# Patient Record
Sex: Female | Born: 1995 | Hispanic: Refuse to answer | Marital: Single | Smoking: Never smoker
Health system: Southern US, Community
[De-identification: ages and names within clinical notes are randomized; demographics above are authoritative.]

---
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
## 2017-01-28 ENCOUNTER — Encounter: Payer: Self-pay | Admitting: Family Medicine

## 2017-01-28 ENCOUNTER — Ambulatory Visit (INDEPENDENT_AMBULATORY_CARE_PROVIDER_SITE_OTHER): Payer: BLUE CROSS/BLUE SHIELD | Admitting: Family Medicine

## 2017-01-28 VITALS — BP 99/62 | HR 75 | Temp 99.0°F | Resp 14

## 2017-01-28 DIAGNOSIS — S060X0A Concussion without loss of consciousness, initial encounter: Secondary | ICD-10-CM

## 2017-01-28 NOTE — Progress Notes (Signed)
Patient presents today with symptoms of concussion. Patient states that she was hit on the right side of her face this morning with a volleyball. She immediately had vision symptoms that then later improved. She denies any loss of consciousness. She denies any history of nausea or vomiting. She denies any history of concussions in the past. She states that mostly right now she feels in a fog and has head pressure. She also has some neck discomfort which she describes as muscular.  ROS: Negative except mentioned above. Vitals as per Epic. 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

## 2017-02-07 ENCOUNTER — Ambulatory Visit (INDEPENDENT_AMBULATORY_CARE_PROVIDER_SITE_OTHER): Payer: BLUE CROSS/BLUE SHIELD | Admitting: Family Medicine

## 2017-02-07 ENCOUNTER — Encounter: Payer: Self-pay | Admitting: Family Medicine

## 2017-02-07 VITALS — BP 123/66 | HR 83 | Temp 99.2°F | Resp 14

## 2017-02-07 DIAGNOSIS — S060X0D Concussion without loss of consciousness, subsequent encounter: Secondary | ICD-10-CM

## 2017-02-07 NOTE — Progress Notes (Signed)
Patient presents today for follow-up regarding her concussion. Patient states that she is feeling back to normal. She returned back to class last Wednesday. She has had no problems with sleep or appetite. She has been able to do work on the computer and has been able to use her phone without any issues. She has not done any physical activity as of yet. She is not taking any medications to relieve any symptoms.  ROS: Negative except mentioned above.  Vitals as per Epic.  GENERAL: NAD HEENT: no pharyngeal erythema, no exudate RESP: CTA B CARD: RRR NEURO: CN II-XII grossly intact, -Rombergs, no nystagmus appreciated  A/P: Concussion - asymptomatic, reviewed Impact Test, patient can start to progress physical activity through protocol, trainer was informed, seek medical attention if any further problems.

## 2017-06-19 IMAGING — CR DG FINGER THUMB 2+V*L*
1 series · 3 of 3 positions shown · non-contrast
Comparison: None.

CLINICAL DATA: Pt states she fell 2 days and bent left thumb
backwards. Pain bruisinf and swelling left 1st metacarpal. shielded

EXAM:
LEFT THUMB 2+V

[Series 1: dg finger thumb left · 0.14mm/px · 3 of 3 slices shown]
[im 1/3]
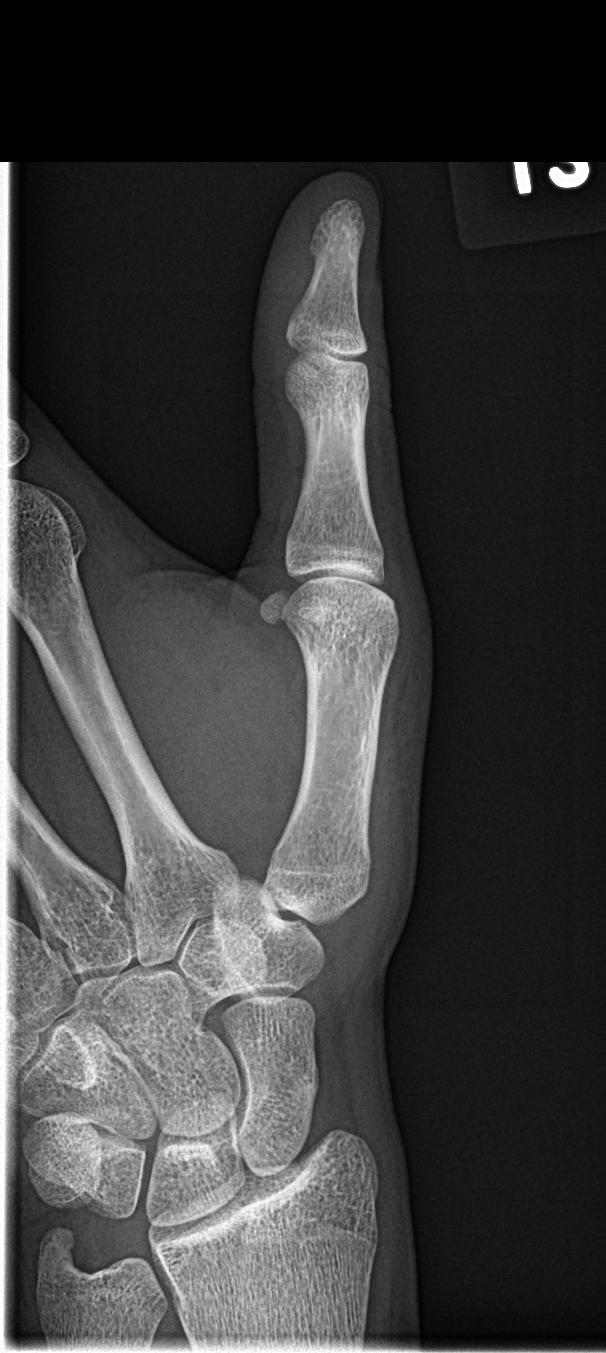
[im 2/3]
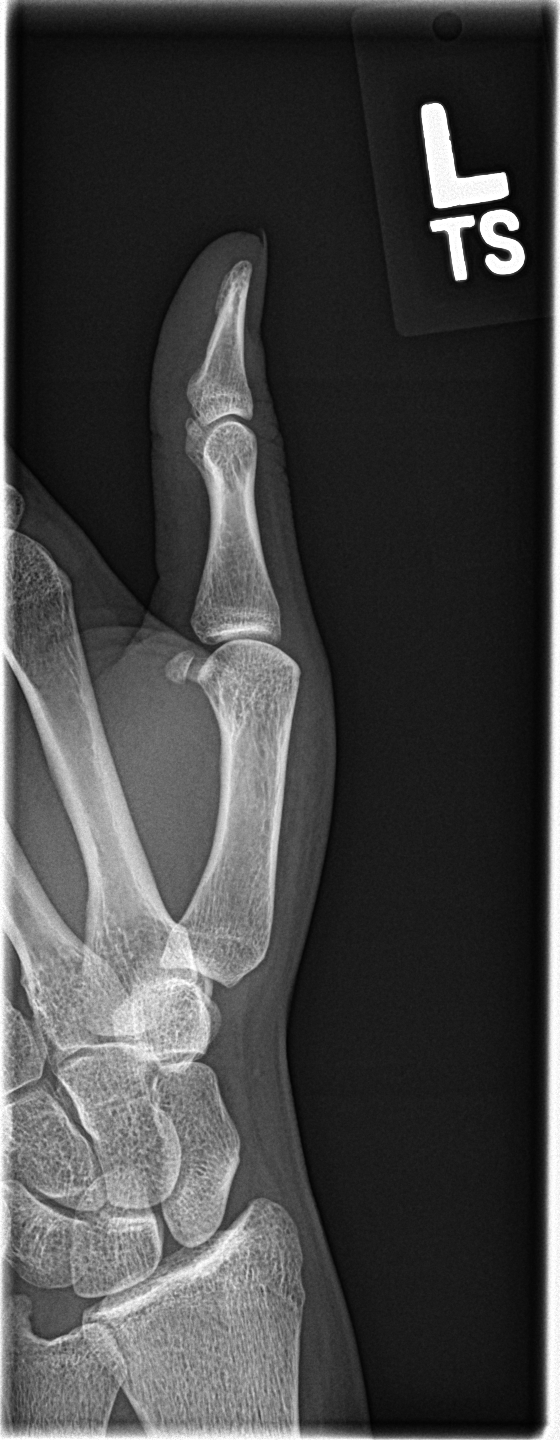
[im 3/3]
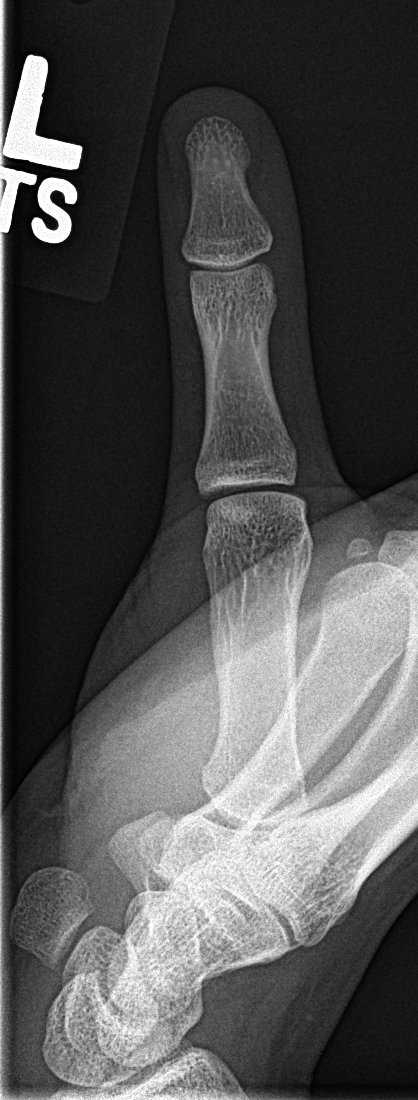

[3 of 3 positions shown; findings below may reference images not displayed]

FINDINGS: There is no evidence of fracture or dislocation. There is no
evidence of arthropathy or other focal bone abnormality. Soft
tissues are unremarkable
IMPRESSION: Negative.

## 2017-08-22 ENCOUNTER — Encounter: Payer: Self-pay | Admitting: Family Medicine

## 2017-08-22 ENCOUNTER — Ambulatory Visit (INDEPENDENT_AMBULATORY_CARE_PROVIDER_SITE_OTHER): Payer: Self-pay | Admitting: Family Medicine

## 2017-08-22 DIAGNOSIS — M25511 Pain in right shoulder: Secondary | ICD-10-CM

## 2017-08-22 DIAGNOSIS — G8929 Other chronic pain: Secondary | ICD-10-CM

## 2017-09-03 NOTE — Progress Notes (Signed)
Patient presents today with chronic shoulder pain. This is the first time I am seeing her for this problem. She admits that she has been able to play volleyball through the symptoms. She continued to have pain this summer so she decided to go see a sports orthopedic physician [place].  She thinks her first subluxation episode began when she was diving for volleyball and landed on her right shoulder which happened last year. She took NSAIDs and worked with her Event organiserathletic trainer to modify activitites. Currently, her pain is intermittent, mild to moderate, exacerbated with serving and improved with rest. She denies associated weakness, numbness or tingling.  She presents today with a copy of her MRI. The MRI was read as showing a SLAP tear with high grade tear of supraspinatus tendon.  She states the recommendation from the surgeon was that she do a trial of PT and activity modification. He believes her symptoms are mostly related to impingement and scap. dysfunction given the overhead sport she plays. He did not feel that surgery was imperative at this time.   ROS: Negative except mentioned above.  GENERAL: NAD MSK: R Shoulder: no focal area of tenderness to palpation, discomfort at 150 degrees of forward flexion, decreased internal rotation, -sulcus, -empty can, -Hawkins, -apprehension, discomfort with lift off, +O'Briens, nv intact  NEURO: CN II-XII grossly intact   A/P: Chronic Right Shoulder Pain - discussed history and exam with orthopedic surgeon in [place] (Dr. Cecelia [place]) who believes Kemi's symptoms are mostly related to impingement, he believes she would benefit from PT and activity modification for now, he does not feel she would successfully return back to her level of volleyball activities if she were to have surgery at this time. He suggests follow-up with him or Dr. Ardine Engiehl is symptoms persist/worsen. I recommend that patient follow up with Dr. Ardine Engiehl the next time he is on campus so  that he can review her MRI and do a thorough shoulder exam. I discussed this with athletic trainer. For now I recommend that she work with PT and also modify activity with volleyball. Will prescribe NSAIDs to take if needed.

## 2017-10-08 ENCOUNTER — Encounter: Payer: Self-pay | Admitting: Family Medicine

## 2017-10-08 ENCOUNTER — Ambulatory Visit (INDEPENDENT_AMBULATORY_CARE_PROVIDER_SITE_OTHER): Payer: Self-pay | Admitting: Family Medicine

## 2017-10-08 VITALS — BP 107/64 | HR 60 | Temp 97.7°F | Resp 14

## 2017-10-08 DIAGNOSIS — R21 Rash and other nonspecific skin eruption: Secondary | ICD-10-CM

## 2017-10-08 MED ORDER — CLOTRIMAZOLE-BETAMETHASONE 1-0.05 % EX CREA: 1.0000 "application " | TOPICAL_CREAM | Freq: Two times a day (BID) | CUTANEOUS | 0 refills | Status: AC

## 2017-10-08 NOTE — Progress Notes (Signed)
Patient presents today with symptoms of rash on her right mid forearm for 2 weeks. She states the area is itchy at times. Denies that it hurts. Initially started off with a few red bumps. She has been covering it during volleyball games and practice. She denies being around any pets or being outside in the grass/woods. She denies wearing anything on that area (ie, hair band, etc). Denies the rash anywhere else.   ROS: Negative except mentioned above. Vitals as per Epic GENERAL: NAD RESP: CTA B CARD: RRR SKIN: erythematous slight raised rash on the volar surface of the mid forearm, no tenderness, no discharge from the area, no streaks, no other rash noted anywhere else NEURO: CN II-XII grossly intact   A/P: Skin Rash Right Forearm - unsure of etiology but will treat with Lotrisone given dermatitis/fungal properties, keep area clean and dry, avoid wearing anything over the area, seek medical attention if symptoms persist or worsen.

## 2017-12-16 ENCOUNTER — Ambulatory Visit (INDEPENDENT_AMBULATORY_CARE_PROVIDER_SITE_OTHER): Payer: Self-pay | Admitting: Family Medicine

## 2017-12-16 VITALS — BP 105/59 | HR 68 | Temp 98.1°F | Resp 14

## 2017-12-16 DIAGNOSIS — R0981 Nasal congestion: Secondary | ICD-10-CM

## 2017-12-16 DIAGNOSIS — H9203 Otalgia, bilateral: Secondary | ICD-10-CM

## 2017-12-16 MED ORDER — AMOXICILLIN 500 MG PO CAPS: 500.0000 mg | ORAL_CAPSULE | Freq: Two times a day (BID) | ORAL | 0 refills | Status: AC

## 2017-12-16 NOTE — Progress Notes (Signed)
Patient presents today with symptoms of ear pain bilaterally.  Patient states that she has had nasal congestion and popping of her ears.  She has had some colored mucus that she has been blowing out of her nose and coughing up.  She denies any fever or chills.  She denies any swimming recently.  She denies any discharge from the ears.  She denies any sore throat, chest pain, shortness of breath, nausea, vomiting, diarrhea, abdominal pain.  She has been taking Sudafed on occasion which has not helped much.  ROS: Negative except mentioned above. Vitals as per Epic GENERAL: NAD HEENT: no significant pharyngeal erythema, no exudate, minimal erythema of right TM, no significant erythema of left TM, no significant bulging bilaterally, no discharge from the ears, no tenderness with movement of pinna, no significant cervical LAD RESP: CTA B CARD: RRR NEURO: CN II-XII grossly intact   A/P: Nasal congestion, Otalgia -treat with Amoxicillin given colored mucus and sinus pressure, Ibuprofen as needed, Claritin-D as needed, seek medical attention if symptoms persist or worsen as discussed.

## 2018-01-24 ENCOUNTER — Ambulatory Visit (INDEPENDENT_AMBULATORY_CARE_PROVIDER_SITE_OTHER): Payer: Self-pay | Admitting: Family Medicine

## 2018-01-24 ENCOUNTER — Encounter: Payer: Self-pay | Admitting: Family Medicine

## 2018-01-24 VITALS — BP 106/73 | HR 83 | Temp 97.8°F | Resp 14

## 2018-01-24 DIAGNOSIS — J029 Acute pharyngitis, unspecified: Secondary | ICD-10-CM

## 2018-01-24 LAB — POCT INFLUENZA A/B
Influenza A, POC: NEGATIVE
Influenza B, POC: NEGATIVE

## 2018-01-24 LAB — POCT RAPID STREP A (OFFICE): Rapid Strep A Screen: NEGATIVE

## 2018-01-24 NOTE — Progress Notes (Signed)
Patient presents today with symptoms of sore throat for the past 3 days.  Patient states that her pain level is about a 6 out of 10.  She denies any fever or chills.  Her sore throat is worse in the morning.  Has had minimal cough associated.  She denies any significant headache neck stiffness photophobia, chest pain, shortness of breath, abdominal pain, extreme fatigue.  She denies any history of infectious mono in the past.  She has been taking Tylenol/Ibuprofen intermittently.  Admits to last menstrual period being 2 weeks ago.  ROS: Negative except mentioned above. Vitals as per Epic GENERAL: NAD HEENT: moderate pharyngeal erythema, no exudate, no erythema of TMs, no significant cervical LAD RESP: CTA B CARD: RRR NEURO: CN II-XII grossly intact   A/P: Pharyngitis -rapid strep test and flu screen today in the office were negative, will send throat culture, Tylenol/ibuprofen as needed, Claritin as needed for any postnasal drip, rest, hydration, seek medical attention if symptoms persist or worsen as discussed.  No athletic activity or class if febrile.

## 2018-01-28 ENCOUNTER — Encounter: Payer: Self-pay | Admitting: Family Medicine

## 2018-01-28 ENCOUNTER — Ambulatory Visit (INDEPENDENT_AMBULATORY_CARE_PROVIDER_SITE_OTHER): Payer: Self-pay | Admitting: Family Medicine

## 2018-01-28 VITALS — BP 104/67 | HR 89 | Temp 97.9°F | Resp 14

## 2018-01-28 DIAGNOSIS — J029 Acute pharyngitis, unspecified: Secondary | ICD-10-CM

## 2018-01-28 LAB — CULTURE, GROUP A STREP: STREP A CULTURE: NEGATIVE

## 2018-01-28 NOTE — Progress Notes (Signed)
Patient presents today with symptoms of persistent sore throat.  She also has had some nasal congestion of recent.  She states that her sore throat is worse in the mornings.  She denies any fever or chills.  She denies any abdominal pain, diarrhea, vomiting, extreme fatigue, severe headache.  She started Claritin-D today.  She has been taking Tylenol and Ibuprofen intermittently.  Her throat culture did come back as negative.  She denies ever having infectious mono in the past.  ROS: Negative except mentioned above. Vitals as per Epic.  GENERAL: NAD HEENT: mild pharyngeal erythema, no exudate, no erythema of TMs, no significant cervical LAD RESP: CTA B CARD: RRR NEURO: CN II-XII grossly intact   A/P: Pharyngitis -will draw CBC and Monospot test, rest, hydration, continue antihistamine/decongestant for nasal congestion, gargle with warm salt water once or twice a day, Tylenol/Ibuprofen as needed, no athletic activity until lab results have been reviewed and discussed with patient or athletic trainer, seek medical attention if symptoms persist or worsen as discussed.

## 2018-01-29 LAB — CBC WITH DIFFERENTIAL/PLATELET
BASOS ABS: 0 10*3/uL (ref 0.0–0.2)
BASOS: 0 %
EOS (ABSOLUTE): 0 10*3/uL (ref 0.0–0.4)
EOS: 0 %
HEMATOCRIT: 38.9 % (ref 34.0–46.6)
HEMOGLOBIN: 13.1 g/dL (ref 11.1–15.9)
IMMATURE GRANS (ABS): 0 10*3/uL (ref 0.0–0.1)
Immature Granulocytes: 0 %
LYMPHS ABS: 1.8 10*3/uL (ref 0.7–3.1)
LYMPHS: 22 %
MCH: 29.4 pg (ref 26.6–33.0)
MCHC: 33.7 g/dL (ref 31.5–35.7)
MCV: 87 fL (ref 79–97)
MONOCYTES: 6 %
Monocytes Absolute: 0.5 10*3/uL (ref 0.1–0.9)
NEUTROS ABS: 5.9 10*3/uL (ref 1.4–7.0)
Neutrophils: 72 %
Platelets: 268 10*3/uL (ref 150–450)
RBC: 4.45 x10E6/uL (ref 3.77–5.28)
RDW: 13.2 % (ref 11.7–15.4)
WBC: 8.3 10*3/uL (ref 3.4–10.8)

## 2018-01-29 LAB — MONONUCLEOSIS SCREEN: Mono Screen: NEGATIVE

## 2018-10-08 ENCOUNTER — Other Ambulatory Visit: Payer: Self-pay

## 2018-10-08 DIAGNOSIS — Z20822 Contact with and (suspected) exposure to covid-19: Secondary | ICD-10-CM

## 2018-10-09 LAB — NOVEL CORONAVIRUS, NAA: SARS-CoV-2, NAA: NOT DETECTED

## 2018-10-22 ENCOUNTER — Other Ambulatory Visit: Payer: Self-pay | Admitting: *Deleted

## 2018-10-22 DIAGNOSIS — Z20822 Contact with and (suspected) exposure to covid-19: Secondary | ICD-10-CM

## 2018-10-23 LAB — NOVEL CORONAVIRUS, NAA: SARS-CoV-2, NAA: NOT DETECTED

## 2018-11-20 ENCOUNTER — Other Ambulatory Visit: Payer: Self-pay | Admitting: *Deleted

## 2018-11-20 DIAGNOSIS — Z20822 Contact with and (suspected) exposure to covid-19: Secondary | ICD-10-CM

## 2018-11-21 LAB — NOVEL CORONAVIRUS, NAA: SARS-CoV-2, NAA: NOT DETECTED
